# Patient Record
Sex: Male | Born: 1980 | Race: Black or African American | Hispanic: No | Marital: Single | Smoking: Current every day smoker
Health system: Southern US, Community
[De-identification: ages and names within clinical notes are randomized; demographics above are authoritative.]

---

## 2010-02-27 ENCOUNTER — Emergency Department (HOSPITAL_COMMUNITY): Admission: EM | Admit: 2010-02-27 | Discharge: 2010-02-27 | Payer: Self-pay | Admitting: Emergency Medicine

## 2011-07-24 ENCOUNTER — Emergency Department: Payer: Self-pay | Admitting: Emergency Medicine

## 2011-08-22 ENCOUNTER — Emergency Department: Payer: Self-pay | Admitting: Unknown Physician Specialty

## 2013-05-01 ENCOUNTER — Emergency Department (HOSPITAL_COMMUNITY)
Admission: EM | Admit: 2013-05-01 | Discharge: 2013-05-01 | Disposition: A | Discharge status: Home / Self Care | Payer: PRIVATE HEALTH INSURANCE | Attending: Emergency Medicine | Admitting: Emergency Medicine

## 2013-05-01 ENCOUNTER — Emergency Department (HOSPITAL_COMMUNITY): Payer: PRIVATE HEALTH INSURANCE

## 2013-05-01 ENCOUNTER — Encounter (HOSPITAL_COMMUNITY): Payer: Self-pay | Admitting: Family Medicine

## 2013-05-01 DIAGNOSIS — S6980XA Other specified injuries of unspecified wrist, hand and finger(s), initial encounter: Secondary | ICD-10-CM | POA: Insufficient documentation

## 2013-05-01 DIAGNOSIS — F17200 Nicotine dependence, unspecified, uncomplicated: Secondary | ICD-10-CM | POA: Insufficient documentation

## 2013-05-01 DIAGNOSIS — Y990 Civilian activity done for income or pay: Secondary | ICD-10-CM | POA: Insufficient documentation

## 2013-05-01 DIAGNOSIS — Y9289 Other specified places as the place of occurrence of the external cause: Secondary | ICD-10-CM | POA: Insufficient documentation

## 2013-05-01 DIAGNOSIS — W319XXA Contact with unspecified machinery, initial encounter: Secondary | ICD-10-CM | POA: Insufficient documentation

## 2013-05-01 DIAGNOSIS — Y9389 Activity, other specified: Secondary | ICD-10-CM | POA: Insufficient documentation

## 2013-05-01 DIAGNOSIS — IMO0001 Reserved for inherently not codable concepts without codable children: Secondary | ICD-10-CM

## 2013-05-01 DIAGNOSIS — M20019 Mallet finger of unspecified finger(s): Secondary | ICD-10-CM | POA: Insufficient documentation

## 2013-05-01 DIAGNOSIS — S6990XA Unspecified injury of unspecified wrist, hand and finger(s), initial encounter: Secondary | ICD-10-CM | POA: Insufficient documentation

## 2013-05-01 NOTE — ED Notes (Signed)
Splint applied

## 2013-05-01 NOTE — ED Provider Notes (Signed)
CSN: [id number]     Arrival date & time 05/01/13  1239 History  This chart was scribed for non-physician practitioner Reginald Ceo, PA-C, working with Reginald Octave, MD by Reginald Sims, ED Scribe. This patient was seen in room TR06C/TR06C and the patient's care was started at 1:24 PM.   Chief Complaint  Patient presents with  . Finger Injury   The history is provided by the patient. No language interpreter was used.   HPI Comments: Reginald Sims is a 32 y.o. male who presents to the Emergency Department complaining of an injury to his right ring finger that occurred last Wednesday, or 3-4 days ago, while he was at work and states that he "pushed something wrong" and "jammed my finger." He reports associated constant pain to the area.  Patient denies any other potential injuries. He states that he is unable to move the finger due to pain. He denies any numbness or paresthesias to the area, nausea, vomiting, or any other symptoms at this time. Patient is left-handed.   History reviewed. No pertinent past medical history. History reviewed. No pertinent past surgical history. History reviewed. No pertinent family history. History  Substance Use Topics  . Smoking status: Current Every Day Smoker  . Smokeless tobacco: Not on file  . Alcohol Use: Yes    Review of Systems  Gastrointestinal: Negative for nausea and vomiting.  Neurological: Negative for numbness.  All other systems reviewed and are negative.    Allergies  Review of patient's allergies indicates no known allergies.  Home Medications  No current outpatient prescriptions on file.  Triage Vitals: BP 126/67  Pulse 67  Temp(Src) 98.3 F (36.8 C)  Resp 18  SpO2 98%  Filed Vitals:   05/01/13 1253  BP: 126/67  Pulse: 67  Temp: 98.3 F (36.8 C)  Resp: 18  SpO2: 98%    Physical Exam  Nursing note and vitals reviewed. Constitutional: He is oriented to person, place, and time. He appears well-developed and  well-nourished. No distress.  HENT:  Head: Normocephalic and atraumatic.  Eyes: Conjunctivae are normal.  Neck: Normal range of motion. Neck supple.  Cardiovascular: Normal rate, regular rhythm and normal heart sounds.   Radial pulses present bilaterally.    Pulmonary/Chest: Effort normal and breath sounds normal. No respiratory distress.  Musculoskeletal: Normal range of motion. He exhibits no edema and no tenderness.  Unable to extend the right ring ringer at the DIP joint.  DIP joint in constant flexion.  No tenderness to palpation to the right ring finger throughout. No other limitations with ROM of the digits of the right hand. No tenderness to the other digits of the right hand or right wrist throughout.   Neurological: He is alert and oriented to person, place, and time.  Gross sensation to the right hand throughout.   Skin: Skin is warm and dry.  No erythema, edema, ecchymosis, or wounds to the hands bilaterally  Psychiatric: He has a normal mood and affect. His behavior is normal.    ED Course  Procedures (including critical care time)  DIAGNOSTIC STUDIES: Oxygen Saturation is 98% on room air, normal by my interpretation.    COORDINATION OF CARE: 1:26PM- Discussed that the pain may be due to an issue with the tendons in his fingers. Will order an x-ray of the right ring finger. Will order a splint to immobilize the finger. Discussed treatment plan with patient at bedside and patient verbalized agreement.   Labs Review Labs Reviewed - No data  to display  Imaging Review Dg Finger Ring Right  05/01/2013   CLINICAL DATA:  Injured finger. Can not extend the finger at the DIP joint.  EXAM: RIGHT RING FINGER 2+V  COMPARISON:  None  FINDINGS: There is a flexion deformity at the DIP joint of the ring finger. No definite fracture is identified. This is most likely due to a complete rupture of the extensor tendon.  IMPRESSION: Flexion deformity at the DIP joint of the ring finger most  consistent with a rupture of the extensor tendon.  No acute fracture.   Electronically Signed   By: Reginald Sims M.D.   On: 05/01/2013 14:03    MDM   1. 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 male who presents to the Emergency Department complaining of an injury to his right ring finger.  Finger x-ray ordered.  Finger splint ordered.     Patient likely has a mallet deformity of the 4th phalanx of the right hand.  No fracture or dislocation on x-rays. Patient unable to extend DIP joint.  No other neurovascular deficits. Finger was splinted.  Patient instructed to keep splint on for 6 weeks and not take it off.  Patient instructed to follow-up with orthopedics.  Patient was instructed to return to the ED if they experience any cyanosis, redness/edema, fever or other concerns.  Patient was in agreement with discharge and plan.     Final impressions: 1. 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 Manus Sims   I personally performed the services described in this documentation, which was scribed in my presence. The recorded information has been reviewed and is accurate.   Reginald Ledger, PA-C 05/03/13 1324

## 2013-05-01 NOTE — ED Notes (Signed)
Per pt sts was at work and injured right ring finger on a machine. Denies pain. There is finger deformity.

## 2013-05-03 NOTE — ED Provider Notes (Signed)
Medical screening examination/treatment/procedure(s) were performed by non-physician practitioner and as supervising physician I was immediately available for consultation/collaboration.   Adisson Deak, MD 05/03/13 1640 

## 2014-11-29 ENCOUNTER — Encounter (HOSPITAL_COMMUNITY): Payer: Self-pay

## 2014-11-29 ENCOUNTER — Emergency Department (HOSPITAL_COMMUNITY): Payer: PRIVATE HEALTH INSURANCE

## 2014-11-29 ENCOUNTER — Emergency Department (HOSPITAL_COMMUNITY)
Admission: EM | Admit: 2014-11-29 | Discharge: 2014-11-29 | Disposition: A | Discharge status: Home / Self Care | Payer: PRIVATE HEALTH INSURANCE | Attending: Emergency Medicine | Admitting: Emergency Medicine

## 2014-11-29 DIAGNOSIS — Z720 Tobacco use: Secondary | ICD-10-CM | POA: Insufficient documentation

## 2014-11-29 DIAGNOSIS — F439 Reaction to severe stress, unspecified: Secondary | ICD-10-CM | POA: Insufficient documentation

## 2014-11-29 DIAGNOSIS — R079 Chest pain, unspecified: Secondary | ICD-10-CM | POA: Insufficient documentation

## 2014-11-29 LAB — CBC
HEMATOCRIT: 42.2 % (ref 39.0–52.0)
Hemoglobin: 14.1 g/dL (ref 13.0–17.0)
MCH: 28.7 pg (ref 26.0–34.0)
MCHC: 33.4 g/dL (ref 30.0–36.0)
MCV: 85.8 fL (ref 78.0–100.0)
Platelets: 146 10*3/uL — ABNORMAL LOW (ref 150–400)
RBC: 4.92 MIL/uL (ref 4.22–5.81)
RDW: 13.3 % (ref 11.5–15.5)
WBC: 6.1 10*3/uL (ref 4.0–10.5)

## 2014-11-29 LAB — BASIC METABOLIC PANEL
Anion gap: 7 (ref 5–15)
BUN: 13 mg/dL (ref 6–23)
CHLORIDE: 103 mmol/L (ref 96–112)
CO2: 26 mmol/L (ref 19–32)
CREATININE: 1.26 mg/dL (ref 0.50–1.35)
Calcium: 8.9 mg/dL (ref 8.4–10.5)
GFR calc non Af Amer: 73 mL/min — ABNORMAL LOW (ref >90)
GFR, EST AFRICAN AMERICAN: 85 mL/min — AB (ref >90)
GLUCOSE: 104 mg/dL — AB (ref 70–99)
Potassium: 3.7 mmol/L (ref 3.5–5.1)
Sodium: 136 mmol/L (ref 135–145)

## 2014-11-29 LAB — I-STAT TROPONIN, ED: Troponin i, poc: 0 ng/mL (ref 0.00–0.08)

## 2014-11-29 MED ORDER — LORAZEPAM 1 MG PO TABS
1.0000 mg | ORAL_TABLET | Freq: Once | ORAL | Status: AC
  Administered 2014-11-29: 1 mg via ORAL
  Filled 2014-11-29: qty 1

## 2014-11-29 NOTE — ED Provider Notes (Signed)
CSN: [id number]     Arrival date & time 11/29/14  2055 History   First MD Initiated Contact with Patient 11/29/14 2118     Chief Complaint  Patient presents with  . Chest Pain     (Consider location/radiation/quality/duration/timing/severity/associated sxs/prior Treatment) HPI Comments: 34 year old male presents with insidious onset of chest pain x 1 hour. Describes pain as a pressure located in the center of the chest radiating to the right side of the chest that is 6/10. Also states right side of chest feels "very tight". No aggravating or alleviating factors. Pain began while patient was at work as a [employer]. No trauma to area, no exertional activity out of his normal, and no history of cardiac issues. Smokes approximately 0.5ppd. Is under significant stress both at work and at home. Denies fever, chills, difficulty breathing, shortness of breath, numbness, tingling, and weakness. No family history of early heart disease.  Patient is a 34 y.o. male presenting with chest pain. The history is provided by the patient.  Chest Pain   History reviewed. No pertinent past medical history. History reviewed. No pertinent past surgical history. History reviewed. No pertinent family history. History  Substance Use Topics  . Smoking status: Current Every Day Smoker  . Smokeless tobacco: Not on file  . Alcohol Use: Yes    Review of Systems  Cardiovascular: Positive for chest pain.  All other systems reviewed and are negative.     Allergies  Review of patient's allergies indicates no known allergies.  Home Medications   Prior to Admission medications   Medication Sig Start Date End Date Taking? Authorizing Provider  ibuprofen (ADVIL,MOTRIN) 200 MG tablet Take 400 mg by mouth every 6 (six) hours as needed for moderate pain.   Yes Historical Provider, MD   BP 138/88 mmHg  Pulse 80  Temp(Src) 98.3 F (36.8 C) (Oral)  Resp 18  Ht 5\' 6"  (1.676 m)  Wt 132 lb (59.875 kg)  BMI 21.32  kg/m2  SpO2 100% Physical Exam  Constitutional: He is oriented to person, place, and time. He appears well-developed and well-nourished. No distress.  HENT:  Head: Normocephalic and atraumatic.  Mouth/Throat: Oropharynx is clear and moist.  Eyes: Conjunctivae and EOM are normal. Pupils are equal, round, and reactive to light.  Neck: Normal range of motion. Neck supple. No JVD present.  Cardiovascular: Normal rate, regular rhythm, normal heart sounds and intact distal pulses.   No extremity edema.  Pulmonary/Chest: Effort normal and breath sounds normal. No respiratory distress. He exhibits no tenderness.  Abdominal: Soft. Bowel sounds are normal. There is no tenderness.  Musculoskeletal: Normal range of motion. He exhibits no edema.  Neurological: He is alert and oriented to person, place, and time. He has normal strength. No sensory deficit.  Speech fluent, goal oriented. Moves limbs without ataxia. Equal grip strength bilateral.  Skin: Skin is warm and dry. He is not diaphoretic.  Psychiatric: He has a normal mood and affect. His behavior is normal.  Nursing note and vitals reviewed.   ED Course  Procedures (including critical care time) Labs Review Labs Reviewed  CBC - Abnormal; Notable for the following:    Platelets 146 (*)    All other components within normal limits  BASIC METABOLIC PANEL - Abnormal; Notable for the following:    Glucose, Bld 104 (*)    GFR calc non Af Amer 73 (*)    GFR calc Af Amer 85 (*)    All other components within normal limits  Rosezena Sensor, ED    Imaging Review Dg Chest 2 View  11/29/2014   CLINICAL DATA:  Chest pain starting today  EXAM: CHEST  2 VIEW  COMPARISON:  None.  FINDINGS: The heart size and mediastinal contours are within normal limits. Both lungs are clear. The visualized skeletal structures are unremarkable.  IMPRESSION: Negative chest.   Electronically Signed   By: Marnee Spring M.D.   On: 11/29/2014 22:50     EKG  Interpretation   Date/Time:  Wednesday November 29 2014 21:06:17 EDT Ventricular Rate:  82 PR Interval:  152 QRS Duration: 91 QT Interval:  348 QTC Calculation: 406 R Axis:   86 Text Interpretation:  Sinus rhythm Borderline T wave abnormalities ST  elev, probable normal early repol pattern Sinus rhythm T wave abnormality  Left ventricular hypertrophy Early repolarization pattern Abnormal ekg  Confirmed by Gerhard Munch  MD [phone]) on 11/29/2014 9:08:51 PM      MDM   Final diagnoses:  Chest pain, unspecified chest pain type  Stress   Nontoxic appearing, NAD. AF VSS. Workup negative. Doubt cardiac. HEART score 2. PERC negative. Under increased stress. Possibly had a panic attack. I advised him to rest, take a few days off of work, and establish care with a primary care physician. Resources given for follow-up. Stable for discharge. Return precautions given. Patient states understanding of treatment care plan and is agreeable.  Kathrynn Speed, PA-C 11/29/14 2307  Purvis Sheffield, MD 11/29/14 [phone]

## 2014-11-29 NOTE — Discharge Instructions (Signed)
Follow-up with the wellness clinic or one of the resources below to establish care with a primary care physician. I highly encourage you to stop smoking.  Chest Pain (Nonspecific) It is often hard to give a specific diagnosis for the cause of chest pain. There is always a chance that your pain could be related to something serious, such as a heart attack or a blood clot in the lungs. You need to follow up with your health care provider for further evaluation. CAUSES   Heartburn.  Pneumonia or bronchitis.  Anxiety or stress.  Inflammation around your heart (pericarditis) or lung (pleuritis or pleurisy).  A blood clot in the lung.  A collapsed lung (pneumothorax). It can develop suddenly on its own (spontaneous pneumothorax) or from trauma to the chest.  Shingles infection (herpes zoster virus). The chest wall is composed of bones, muscles, and cartilage. Any of these can be the source of the pain.  The bones can be bruised by injury.  The muscles or cartilage can be strained by coughing or overwork.  The cartilage can be affected by inflammation and become sore (costochondritis). DIAGNOSIS  Lab tests or other studies may be needed to find the cause of your pain. Your health care provider may have you take a test called an ambulatory electrocardiogram (ECG). An ECG records your heartbeat patterns over a 24-hour period. You may also have other tests, such as:  Transthoracic echocardiogram (TTE). During echocardiography, sound waves are used to evaluate how blood flows through your heart.  Transesophageal echocardiogram (TEE).  Cardiac monitoring. This allows your health care provider to monitor your heart rate and rhythm in real time.  Holter monitor. This is a portable device that records your heartbeat and can help diagnose heart arrhythmias. It allows your health care provider to track your heart activity for several days, if needed.  Stress tests by exercise or by giving  medicine that makes the heart beat faster. TREATMENT   Treatment depends on what may be causing your chest pain. Treatment may include:  Acid blockers for heartburn.  Anti-inflammatory medicine.  Pain medicine for inflammatory conditions.  Antibiotics if an infection is present.  You may be advised to change lifestyle habits. This includes stopping smoking and avoiding alcohol, caffeine, and chocolate.  You may be advised to keep your head raised (elevated) when sleeping. This reduces the chance of acid going backward from your stomach into your esophagus. Most of the time, nonspecific chest pain will improve within 2-3 days with rest and mild pain medicine.  HOME CARE INSTRUCTIONS   If antibiotics were prescribed, take them as directed. Finish them even if you start to feel better.  For the next few days, avoid physical activities that bring on chest pain. Continue physical activities as directed.  Do not use any tobacco products, including cigarettes, chewing tobacco, or electronic cigarettes.  Avoid drinking alcohol.  Only take medicine as directed by your health care provider.  Follow your health care provider's suggestions for further testing if your chest pain does not go away.  Keep any follow-up appointments you made. If you do not go to an appointment, you could develop lasting (chronic) problems with pain. If there is any problem keeping an appointment, call to reschedule. SEEK MEDICAL CARE IF:   Your chest pain does not go away, even after treatment.  You have a rash with blisters on your chest.  You have a fever. SEEK IMMEDIATE MEDICAL CARE IF:   You have increased chest pain  or pain that spreads to your arm, neck, jaw, back, or abdomen.  You have shortness of breath.  You have an increasing cough, or you cough up blood.  You have severe back or abdominal pain.  You feel nauseous or vomit.  You have severe weakness.  You faint.  You have  chills. This is an emergency. Do not wait to see if the pain will go away. Get medical help at once. Call your local emergency services (911 in [place] Do not drive yourself to the hospital. MAKE SURE YOU:   Understand these instructions.  Will watch your condition.  Will get help right away if you are not doing well or get worse. Document Released: 05/14/2005 Document Revised: 08/09/2013 Document Reviewed: 03/09/2008 [employer] [facility] Patient Information 2015 [place], [place]. This information is not intended to replace advice given to you by your health care provider. Make sure you discuss any questions you have with your health care provider.  Stress Stress-related medical problems are becoming increasingly common. The body has a built-in physical response to stressful situations. Faced with pressure, challenge or danger, we need to react quickly. Our bodies release hormones such as cortisol and adrenaline to help do this. These hormones are part of the "fight or flight" response and affect the metabolic rate, heart rate and blood pressure, resulting in a heightened, stressed state that prepares the body for optimum performance in dealing with a stressful situation. It is likely that early man required these mechanisms to stay alive, but usually modern stresses do not call for this, and the same hormones released in today's world can damage health and reduce coping ability. CAUSES  Pressure to perform at work, at school or in sports.  Threats of physical violence.  Money worries.  Arguments.  Family conflicts.  Divorce or separation from significant other.  Bereavement.  New job or unemployment.  Changes in location.  Alcohol or drug abuse. SOMETIMES, THERE IS NO PARTICULAR REASON FOR DEVELOPING STRESS. Almost all people are at risk of being stressed at some time in their lives. It is important to know that some stress is temporary and some is long term.  Temporary stress will go away  when a situation is resolved. Most people can cope with short periods of stress, and it can often be relieved by relaxing, taking a walk or getting any type of exercise, chatting through issues with friends, or having a good night's sleep.  Chronic (long-term, continuous) stress is much harder to deal with. It can be psychologically and emotionally damaging. It can be harmful both for an individual and for friends and family. SYMPTOMS Everyone reacts to stress differently. There are some common effects that help [place] recognize it. In times of extreme stress, people may:  Shake uncontrollably.  Breathe faster and deeper than normal (hyperventilate).  Vomit.  For people with asthma, stress can trigger an attack.  For some people, stress may trigger migraine headaches, ulcers, and body pain. PHYSICAL EFFECTS OF STRESS MAY INCLUDE:  Loss of energy.  Skin problems.  Aches and pains resulting from tense muscles, including neck ache, backache and tension headaches.  Increased pain from arthritis and other conditions.  Irregular heart beat (palpitations).  Periods of irritability or anger.  Apathy or depression.  Anxiety (feeling uptight or worrying).  Unusual behavior.  Loss of appetite.  Comfort eating.  Lack of concentration.  Loss of, or decreased, sex-drive.  Increased smoking, drinking, or recreational drug use.  For women, missed periods.  Ulcers, joint  pain, and muscle pain. Post-traumatic stress is the stress caused by any serious accident, strong emotional damage, or extremely difficult or violent experience such as rape or war. Post-traumatic stress victims can experience mixtures of emotions such as fear, shame, depression, guilt or anger. It may include recurrent memories or images that may be haunting. These feelings can last for weeks, months or even years after the traumatic event that triggered them. Specialized treatment, possibly with medicines and  psychological therapies, is available. If stress is causing physical symptoms, severe distress or making it difficult for you to function as normal, it is worth seeing your caregiver. It is important to remember that although stress is a usual part of life, extreme or prolonged stress can lead to other illnesses that will need treatment. It is better to visit a doctor sooner rather than later. Stress has been linked to the development of high blood pressure and heart disease, as well as insomnia and depression. There is no diagnostic test for stress since everyone reacts to it differently. But a caregiver will be able to spot the physical symptoms, such as:  Headaches.  Shingles.  Ulcers. Emotional distress such as intense worry, low mood or irritability should be detected when the doctor asks pertinent questions to identify any underlying problems that might be the cause. In case there are physical reasons for the symptoms, the doctor may also want to do some tests to exclude certain conditions. If you feel that you are suffering from stress, try to identify the aspects of your life that are causing it. Sometimes you may not be able to change or avoid them, but even a small change can have a positive ripple effect. A simple lifestyle change can make all the difference. STRATEGIES THAT CAN HELP DEAL WITH STRESS:  Delegating or sharing responsibilities.  Avoiding confrontations.  Learning to be more assertive.  Regular exercise.  Avoid using alcohol or street drugs to cope.  Eating a healthy, balanced diet, rich in fruit and vegetables and proteins.  Finding humor or absurdity in stressful situations.  Never taking on more than you know you can handle comfortably.  Organizing your time better to get as much done as possible.  Talking to friends or family and sharing your thoughts and fears.  Listening to music or relaxation tapes.  Relaxation techniques like deep breathing,  meditation, and yoga.  Tensing and then relaxing your muscles, starting at the toes and working up to the head and neck. If you think that you would benefit from help, either in identifying the things that are causing your stress or in learning techniques to help you relax, see a caregiver who is capable of helping you with this. Rather than relying on medications, it is usually better to try and identify the things in your life that are causing stress and try to deal with them. There are many techniques of managing stress including counseling, psychotherapy, aromatherapy, yoga, and exercise. Your caregiver can help you determine what is best for you. Document Released: 10/25/2002 Document Revised: 08/09/2013 Document Reviewed: 09/21/2007 [facility] Patient Information 2015 [place], [place]. This information is not intended to replace advice given to you by your health care provider. Make sure you discuss any questions you have with your health care provider. RESOURCE GUIDE  Chronic Pain Problems: Contact [facility]  [phone] Patients need to be referred by their primary care doctor.  Insufficient Money for Medicine: Contact [employer]:  call "211."   No Primary  Care Doctor: - Call [facility]  [phone] - can help you locate a primary care doctor that  accepts your insurance, provides certain services, etc. - Physician Referral Service- [phone]  Agencies that provide inexpensive medical care: - [facility]  [phone] - [facility]  [phone] - [facility]  [phone] - [facility]  [phone] - [facility]  [phone] [facility]  [phone]  Medicaid-accepting [facility] Providers: - [facility]- [address]  [phone], Mon-Fri 9am-7pm, Sat 9am-1pm - [facility]- [address] [place], [address] [place]  [phone] - [facility]- [address] [place]  [phone] [facility]- [address]  [phone] - Renaye Rakers- [address] [place], [address], [phone]  Only accepts [place] patients after they have their name  applied to their card  Self Pay (no insurance) in [place]: - Sickle Cell Patients: Dr Willey Blade, [facility]  [address] [place], [phone] - [facility]- [address] [place]  [phone]       [facility]- [address]       -     [facility]- see information above (Speak to [employer] if you do not have insurance)       -  [facility]- [address],  [phone]       -  [facility]- [address], [phone]       -  Dr Julio Sicks-  [address], [place], [phone]       -  [facility] - [address] [phone]       -  [facility]- [address], [phone], also [address], [phone]       -    [facility]- [address] [place], [phone], 1st & 3rd Saturday        every month, 10am-1pm  1) Find a Doctor and Pay Out of Pocket Although you won't have to find out who is covered by your insurance plan, it is a good idea to ask around and get recommendations. You will then need to call the office and see if the doctor you have chosen will accept you as a new patient and what types of options they offer for patients who are self-pay. Some doctors offer discounts or will set up payment plans for their patients who do not have insurance, but you will need to ask so you aren't surprised when you get to your appointment.  2) Contact Your Local Health Department Not all health departments have doctors that can see patients for sick visits, but many do, so it is worth a call to see if yours does. If you don't know where your local health department is, you can check in your phone book.  The CDC also has a tool to help you locate your state's health department, and many state websites also have listings of all of their local health departments.  3) Find a Walk-in Clinic If your illness is not likely to be very severe or complicated, you may want to try a walk in clinic. These are popping up all over the country in pharmacies, drugstores, and shopping centers. They're usually staffed  by nurse practitioners or physician assistants that have been trained to treat common illnesses and complaints. They're usually fairly quick and inexpensive. However, if you have serious medical issues or chronic medical problems, these are probably not your best option

## 2014-11-29 NOTE — ED Notes (Signed)
Pt complains of right sided chest pain that started about one hour ago, he describes it as someone standing on him

## 2014-11-29 NOTE — ED Notes (Signed)
Patient states right sided chest pressure that started tonight while on break at work.

## 2014-11-29 NOTE — ED Notes (Signed)
Patient verbalizes understanding of discharge instructions, home care, and follow up care. Patient ambulatory out of department at this time with family member

## 2014-12-06 ENCOUNTER — Inpatient Hospital Stay: Payer: PRIVATE HEALTH INSURANCE | Admitting: Family Medicine

## 2016-01-10 IMAGING — CR DG CHEST 2V
2 series · 2 of 2 positions shown · non-contrast
Comparison: None.

CLINICAL DATA: Chest pain starting today

EXAM:
CHEST  2 VIEW

[w chest pa]
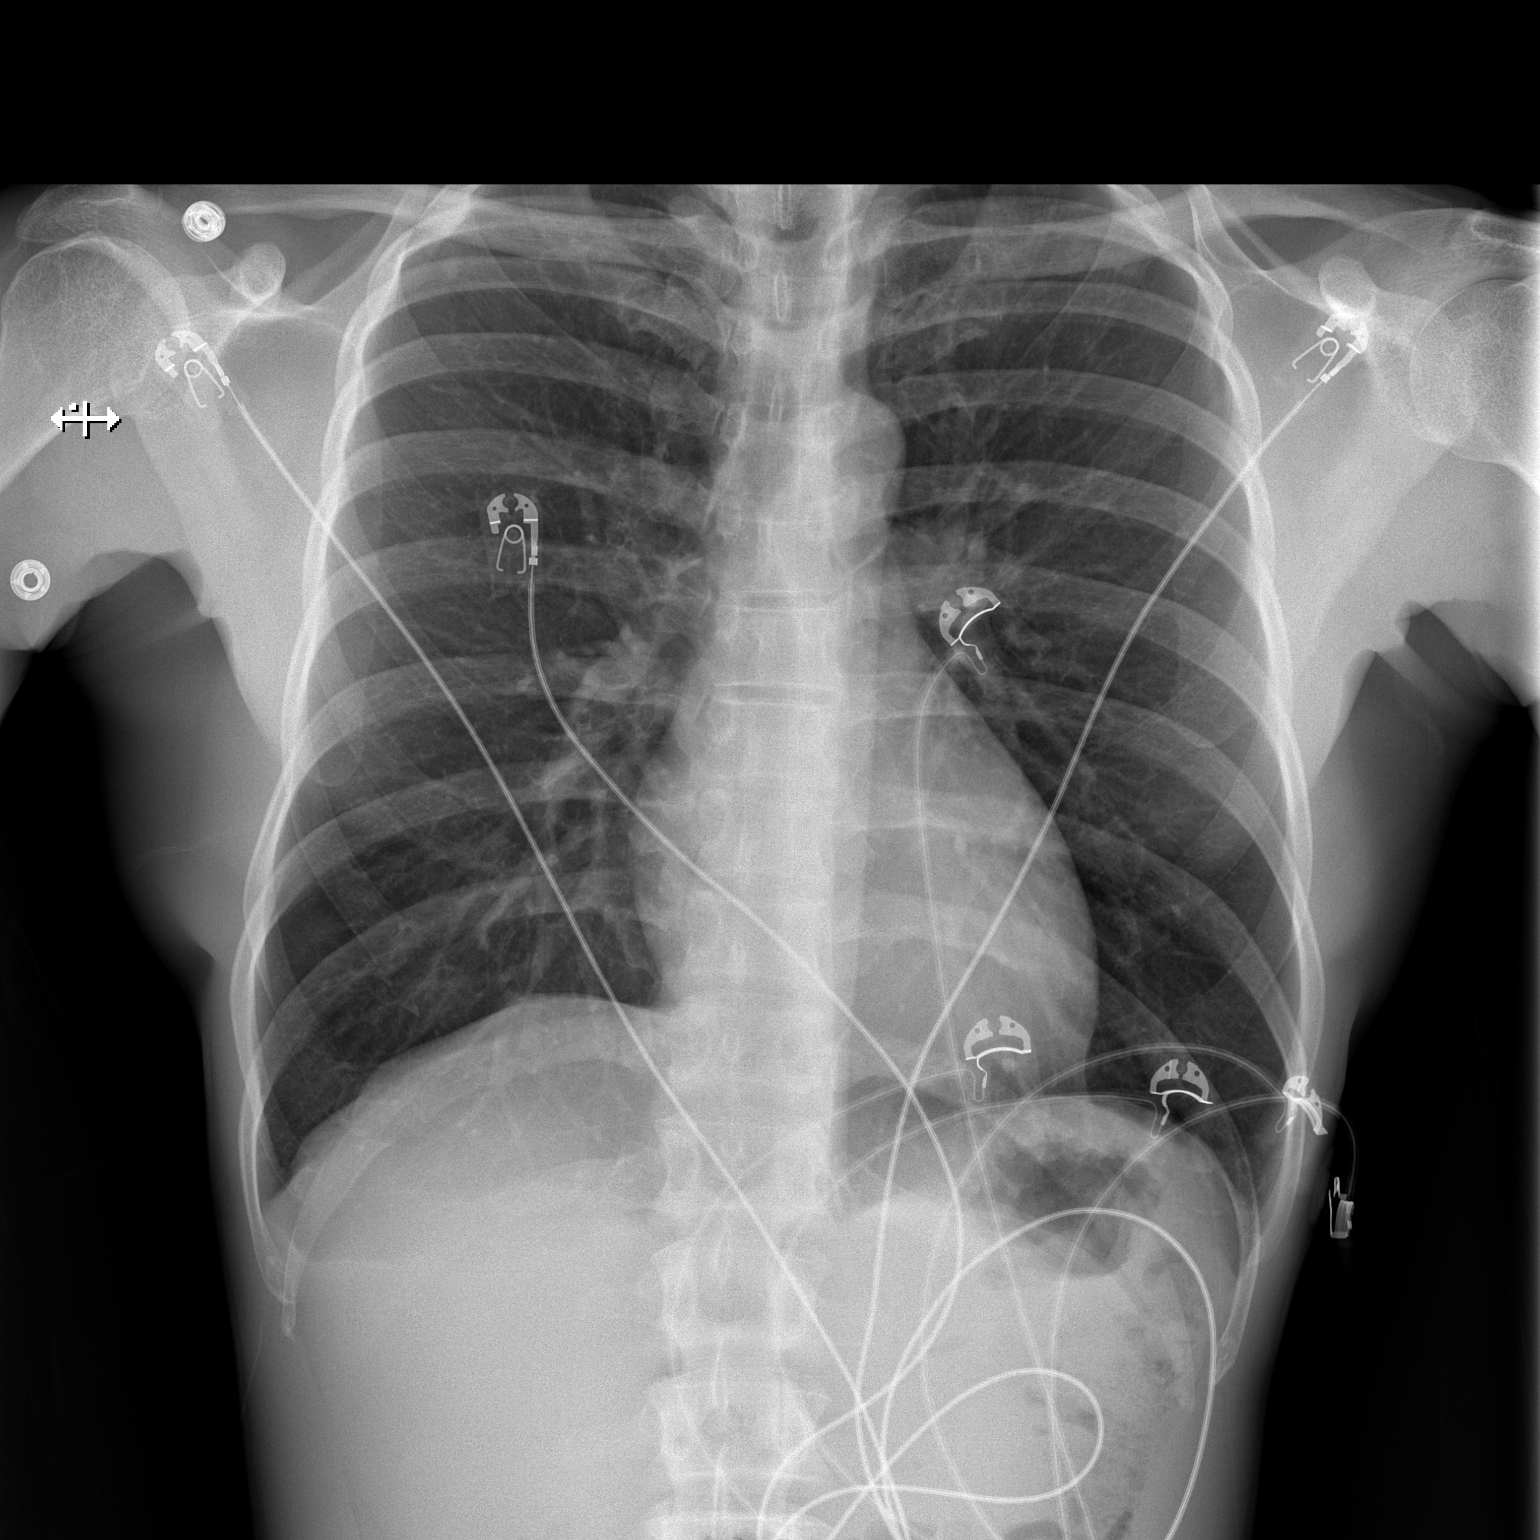

[w chest lat]
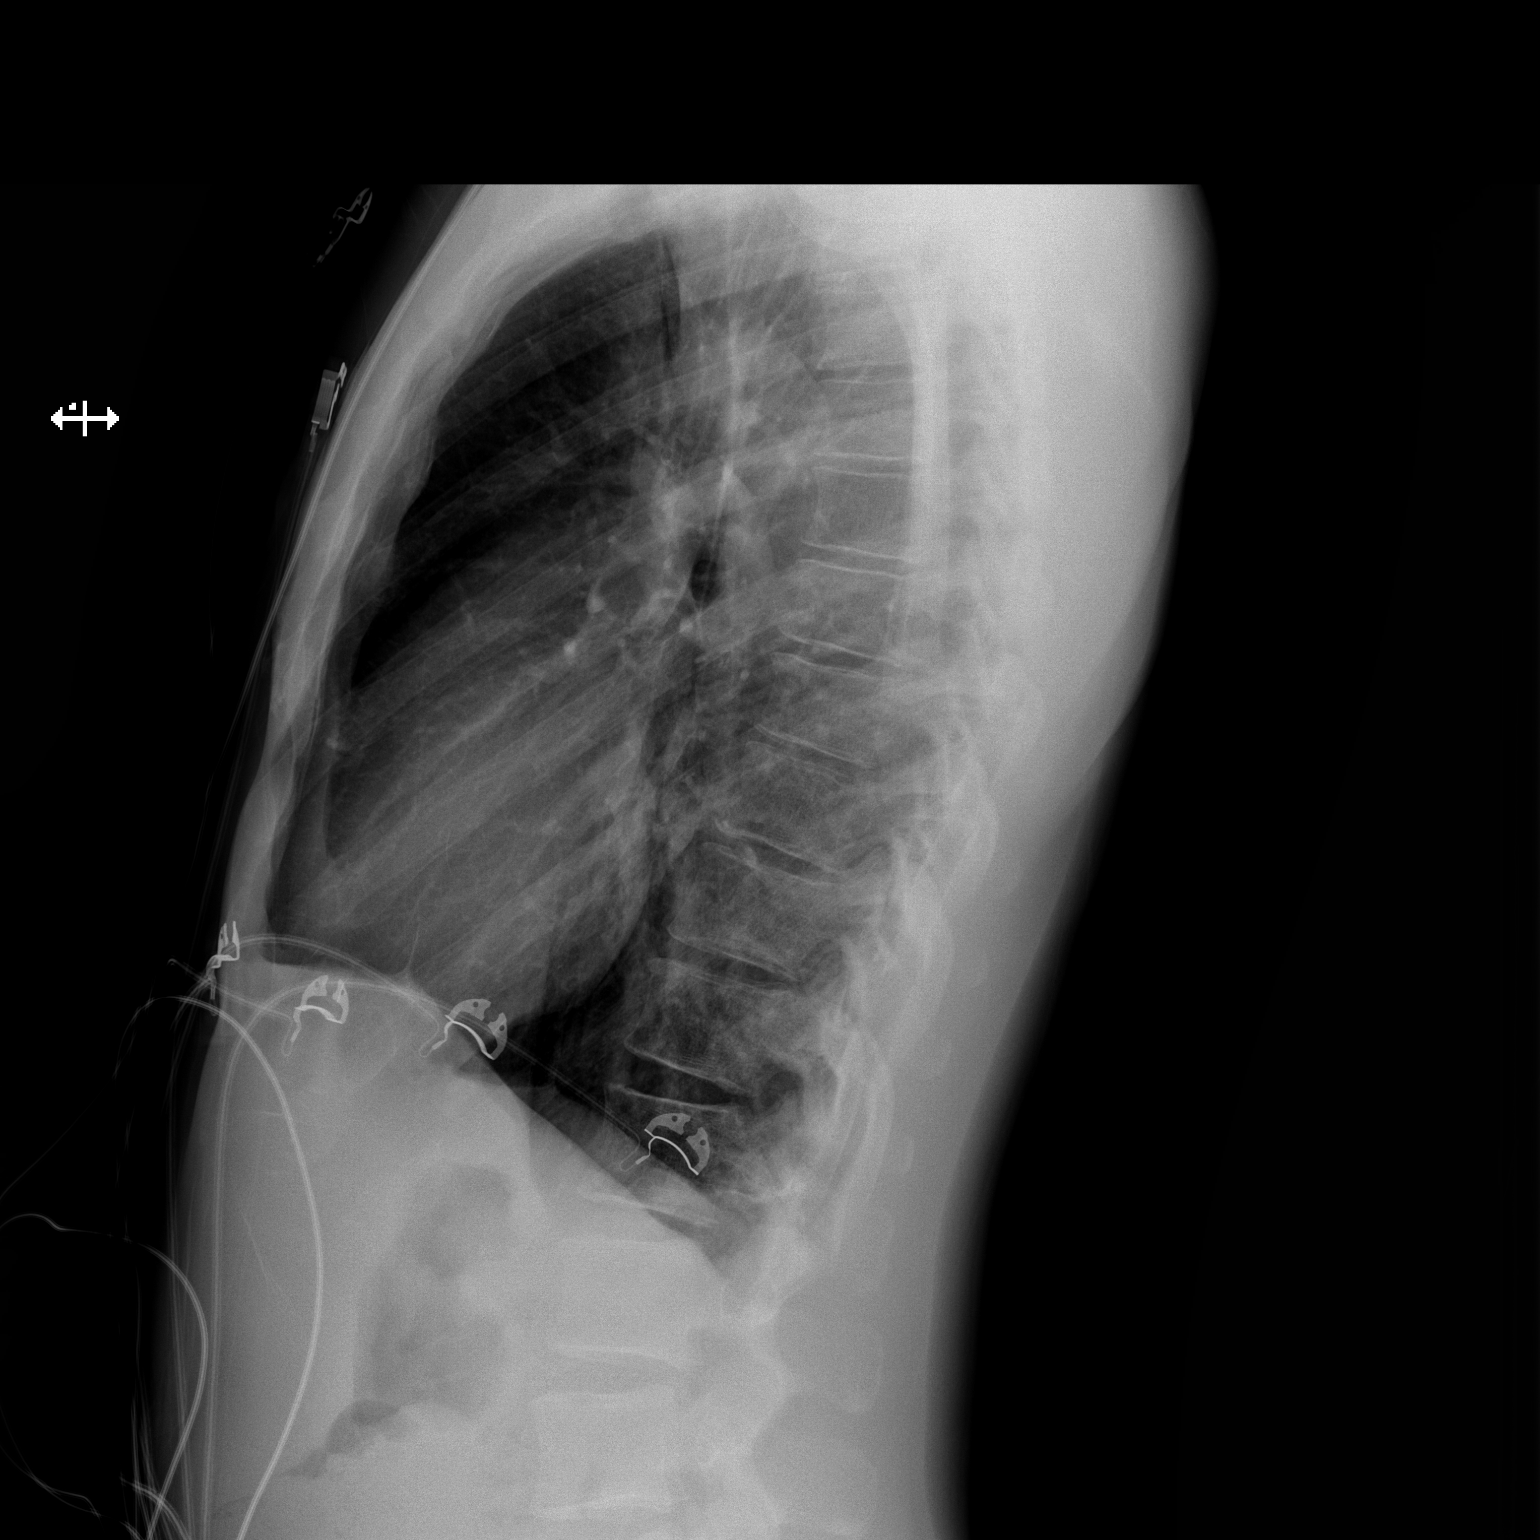

[2 of 2 positions shown; findings below may reference images not displayed]

FINDINGS: The heart size and mediastinal contours are within normal limits.
Both lungs are clear. The visualized skeletal structures are
unremarkable.
IMPRESSION: Negative chest.

## 2018-09-15 ENCOUNTER — Emergency Department
Admission: EM | Admit: 2018-09-15 | Discharge: 2018-09-15 | Disposition: A | Discharge status: Home / Self Care | Payer: PRIVATE HEALTH INSURANCE | Attending: Emergency Medicine | Admitting: Emergency Medicine

## 2018-09-15 ENCOUNTER — Other Ambulatory Visit: Payer: Self-pay

## 2018-09-15 DIAGNOSIS — W25XXXA Contact with sharp glass, initial encounter: Secondary | ICD-10-CM | POA: Insufficient documentation

## 2018-09-15 DIAGNOSIS — Y9389 Activity, other specified: Secondary | ICD-10-CM | POA: Insufficient documentation

## 2018-09-15 DIAGNOSIS — Z23 Encounter for immunization: Secondary | ICD-10-CM | POA: Insufficient documentation

## 2018-09-15 DIAGNOSIS — Y929 Unspecified place or not applicable: Secondary | ICD-10-CM | POA: Insufficient documentation

## 2018-09-15 DIAGNOSIS — S61012A Laceration without foreign body of left thumb without damage to nail, initial encounter: Secondary | ICD-10-CM

## 2018-09-15 DIAGNOSIS — Y998 Other external cause status: Secondary | ICD-10-CM | POA: Insufficient documentation

## 2018-09-15 DIAGNOSIS — F17200 Nicotine dependence, unspecified, uncomplicated: Secondary | ICD-10-CM | POA: Insufficient documentation

## 2018-09-15 MED ORDER — LIDOCAINE HCL (PF) 1 % IJ SOLN
5.0000 mL | Freq: Once | INTRAMUSCULAR | Status: AC
  Administered 2018-09-15: 5 mL
  Filled 2018-09-15: qty 5

## 2018-09-15 MED ORDER — TETANUS-DIPHTH-ACELL PERTUSSIS 5-2.5-18.5 LF-MCG/0.5 IM SUSP
0.5000 mL | Freq: Once | INTRAMUSCULAR | Status: AC
  Administered 2018-09-15: 0.5 mL via INTRAMUSCULAR
  Filled 2018-09-15: qty 0.5

## 2018-09-15 NOTE — ED Triage Notes (Signed)
Pt has cut to left base of left thumb with glass.

## 2018-09-15 NOTE — Discharge Instructions (Addendum)
Keep the areas dry as possible.  If you need to wash your hands just use soap and water and gently dry the area.  Wear a dressing on the area if you are out in public.  Once the bleeding has stopped you may allow the area to get air while you are at home.  Return to the emergency department in 7 to 10 days for suture removal or you may remove them yourself.  If there is any sign of infection please return to the emergency department.

## 2018-09-15 NOTE — ED Provider Notes (Signed)
[facility] Emergency Department Provider Note  ____________________________________________   First MD Initiated Contact with Patient 09/15/18 2042     (approximate)  I have reviewed the triage vital signs and the nursing notes.   HISTORY  Chief Complaint Laceration    HPI Reginald Sims is a 38 y.o. male presents emergency department laceration to the left thumb.  States he was trying to hang a TV on the wall got cut by glass.  Unsure of his last tetanus    No past medical history on file.  There are no active problems to display for this patient.   No past surgical history on file.  Prior to Admission medications   Medication Sig Start Date End Date Taking? Authorizing Provider  ibuprofen (ADVIL,MOTRIN) 200 MG tablet Take 400 mg by mouth every 6 (six) hours as needed for moderate pain.    [provider]    Allergies Patient has no known allergies.  No family history on file.  Social History Social History   Tobacco Use  . Smoking status: Current Every Day Smoker  Substance Use Topics  . Alcohol use: Yes  . Drug use: Not on file    Review of Systems  Constitutional: No fever/chills Eyes: No visual changes. ENT: No sore throat. Respiratory: Denies cough Genitourinary: Negative for dysuria. Musculoskeletal: Negative for back pain.  Positive for laceration to the left thumb Skin: Negative for rash.    ____________________________________________   PHYSICAL EXAM:  VITAL SIGNS: ED Triage Vitals  Enc Vitals Group     BP 09/15/18 2000 (!) 156/94     Pulse Rate 09/15/18 2000 (!) 56     Resp 09/15/18 2000 16     Temp 09/15/18 2000 98 F (36.7 C)     Temp Source 09/15/18 2000 Oral     SpO2 09/15/18 2000 98 %     Weight 09/15/18 2000 145 lb (65.8 kg)     Height 09/15/18 2000 5\' 7"  (1.702 m)     Head Circumference --      Peak Flow --      Pain Score 09/15/18 2003 2     Pain Loc --      Pain Edu? --    Excl. in GC? --     Constitutional: Alert and oriented. Well appearing and in no acute distress. Eyes: Conjunctivae are normal.  Head: Atraumatic. Nose: No congestion/rhinnorhea. Mouth/Throat: Mucous membranes are moist.   Neck:  supple no lymphadenopathy noted Cardiovascular: Normal rate, regular rhythm.  Respiratory: Normal respiratory effort.  No retractions, GU: deferred Musculoskeletal: FROM all extremities, warm and well perfused, positive for a flap-like laceration to the left thumb.  No foreign bodies noted.  No tendon involvement is noted.  Neurovascular is intact. Neurologic:  Normal speech and language.  Skin:  Skin is warm, dry and intact. No rash noted. Psychiatric: Mood and affect are normal. Speech and behavior are normal.  ____________________________________________   LABS (all labs ordered are listed, but only abnormal results are displayed)  Labs Reviewed - No data to display ____________________________________________   ____________________________________________  RADIOLOGY    ____________________________________________   PROCEDURES  Procedure(s) performed:   Marland KitchenMarland KitchenLaceration Repair Date/Time: 09/15/2018 9:33 PM Performed by: Faythe Ghee, PA-C Authorized by: Faythe Ghee, PA-C   Consent:    Consent obtained:  Verbal   Consent given by:  Patient   Risks discussed:  Infection, pain, retained foreign body, tendon damage, poor cosmetic result and poor wound healing  Alternatives discussed:  Delayed treatment Anesthesia (see MAR for exact dosages):    Anesthesia method:  Local infiltration   Local anesthetic:  Lidocaine 1% w/o epi Laceration details:    Location:  Finger   Finger location:  L thumb   Length (cm):  2   Depth (mm):  3 Repair type:    Repair type:  Simple Pre-procedure details:    Preparation:  Patient was prepped and draped in usual sterile fashion Exploration:    Hemostasis achieved with:  Direct pressure   Wound  exploration: wound explored through full range of motion     Wound extent: no foreign bodies/material noted, no muscle damage noted, no nerve damage noted, no tendon damage noted, no underlying fracture noted and no vascular damage noted   Treatment:    Area cleansed with:  Betadine and saline   Amount of cleaning:  Standard   Irrigation solution:  Sterile saline   Irrigation method:  Syringe and tap Skin repair:    Repair method:  Sutures   Suture size:  5-0   Suture material:  Nylon   Suture technique:  Simple interrupted   Number of sutures:  6 Approximation:    Approximation:  Close Post-procedure details:    Dressing:  Non-adherent dressing   Patient tolerance of procedure:  Tolerated well, no immediate complications      ____________________________________________   INITIAL IMPRESSION / ASSESSMENT AND PLAN / ED COURSE  Pertinent labs & imaging results that were available during my care of the patient were reviewed by me and considered in my medical decision making (see chart for details).   Patient is 38 year old male presents emergency department for laceration left thumb.  Positive laceration to the left thumb.  See procedure note. Tdap was given by the nursing staff.  Patient is to have the sutures removed in approximately 10 days.  Keep the areas clean and dry as possible.  If he has to wash the areas to use soap and water only.  Return to the emergency department any sign of infection.  He states he understands will comply.  He was discharged in stable condition.     As part of my medical decision making, I reviewed the following data within the electronic MEDICAL RECORD NUMBER Nursing notes reviewed and incorporated, Old chart reviewed, Notes from prior ED visits and [place] Controlled Substance Database  ____________________________________________   FINAL CLINICAL IMPRESSION(S) / ED DIAGNOSES  Final diagnoses:  Laceration of left thumb without foreign body without  damage to nail, initial encounter      NEW MEDICATIONS STARTED DURING THIS VISIT:  New Prescriptions   No medications on file     Note:  This document was prepared using Dragon voice recognition software and may include unintentional dictation errors.    Faythe Ghee, PA-C 09/15/18 2135    Schaevitz, Myra Rude, MD 09/15/18 [phone]

## 2019-11-10 ENCOUNTER — Emergency Department: Payer: Self-pay

## 2019-11-10 ENCOUNTER — Other Ambulatory Visit: Payer: Self-pay

## 2019-11-10 ENCOUNTER — Emergency Department
Admission: EM | Admit: 2019-11-10 | Discharge: 2019-11-10 | Disposition: A | Discharge status: Home / Self Care | Payer: Self-pay | Attending: Emergency Medicine | Admitting: Emergency Medicine

## 2019-11-10 DIAGNOSIS — R0789 Other chest pain: Secondary | ICD-10-CM | POA: Insufficient documentation

## 2019-11-10 DIAGNOSIS — R0602 Shortness of breath: Secondary | ICD-10-CM | POA: Insufficient documentation

## 2019-11-10 DIAGNOSIS — F17210 Nicotine dependence, cigarettes, uncomplicated: Secondary | ICD-10-CM | POA: Insufficient documentation

## 2019-11-10 DIAGNOSIS — Z20822 Contact with and (suspected) exposure to covid-19: Secondary | ICD-10-CM | POA: Insufficient documentation

## 2019-11-10 LAB — CBC
HCT: 44.3 % (ref 39.0–52.0)
Hemoglobin: 14.7 g/dL (ref 13.0–17.0)
MCH: 28.7 pg (ref 26.0–34.0)
MCHC: 33.2 g/dL (ref 30.0–36.0)
MCV: 86.4 fL (ref 80.0–100.0)
Platelets: 161 10*3/uL (ref 150–400)
RBC: 5.13 MIL/uL (ref 4.22–5.81)
RDW: 13.5 % (ref 11.5–15.5)
WBC: 6 10*3/uL (ref 4.0–10.5)
nRBC: 0 % (ref 0.0–0.2)

## 2019-11-10 LAB — COMPREHENSIVE METABOLIC PANEL
ALT: 25 U/L (ref 0–44)
AST: 26 U/L (ref 15–41)
Albumin: 4.2 g/dL (ref 3.5–5.0)
Alkaline Phosphatase: 43 U/L (ref 38–126)
Anion gap: 5 (ref 5–15)
BUN: 17 mg/dL (ref 6–20)
CO2: 30 mmol/L (ref 22–32)
Calcium: 9 mg/dL (ref 8.9–10.3)
Chloride: 104 mmol/L (ref 98–111)
Creatinine, Ser: 1.29 mg/dL — ABNORMAL HIGH (ref 0.61–1.24)
GFR calc Af Amer: >60 mL/min (ref >60)
GFR calc non Af Amer: >60 mL/min (ref >60)
Glucose, Bld: 105 mg/dL — ABNORMAL HIGH (ref 70–99)
Potassium: 4.7 mmol/L (ref 3.5–5.1)
Sodium: 139 mmol/L (ref 135–145)
Total Bilirubin: 0.5 mg/dL (ref 0.3–1.2)
Total Protein: 7 g/dL (ref 6.5–8.1)

## 2019-11-10 LAB — SARS CORONAVIRUS 2 (TAT 6-24 HRS): SARS Coronavirus 2: NEGATIVE

## 2019-11-10 LAB — TROPONIN I (HIGH SENSITIVITY)
Troponin I (High Sensitivity): 4 ng/L (ref <18)
Troponin I (High Sensitivity): 3 ng/L (ref <18)

## 2019-11-10 MED ORDER — HYDROXYZINE HCL 25 MG PO TABS: 25.0000 mg | ORAL_TABLET | Freq: Three times a day (TID) | ORAL | 0 refills | Status: AC | PRN

## 2019-11-10 MED ORDER — HYDROXYZINE HCL 25 MG PO TABS: 25.0000 mg | ORAL_TABLET | Freq: Three times a day (TID) | ORAL | 0 refills | Status: DC | PRN

## 2019-11-10 NOTE — ED Provider Notes (Signed)
[facility] Emergency Department Provider Note  ____________________________________________   First MD Initiated Contact with Patient 11/10/19 [phone]     (approximate)  I have reviewed the triage vital signs and the nursing notes.   HISTORY  Chief Complaint Shortness of Breath    HPI Reginald Sims is a 39 y.o. male otherwise healthy, no prior lung history who comes in with shortness of breath.  Patient states that he was in the back of a car with a total of 5 people in the car when he started feeling really short of breath had some chest tightness and felt like his throat was swollen.  This started around 2 AM, constant, slightly better but still feeling short of breath, unclear what brought it on.  Denies eating any strange foods or having any rash or prior allergic reactions.  Denies eating something and feeling like he gets stuck in his throat.  States that he currently still feels like he cannot catch his breath. He states he feels like if he falls asleep he wont wake up.  Patient states he no longer smokes.  Patient denies any known coronavirus contacts.  Patient does use THC but none today.  Denies alcohol use today.  Denies any risk factors for PE      Medical: None  There are no problems to display for this patient.   No past surgical history on file.  Prior to Admission medications   Medication Sig Start Date End Date Taking? Authorizing Provider  ibuprofen (ADVIL,MOTRIN) 200 MG tablet Take 400 mg by mouth every 6 (six) hours as needed for moderate pain.    [provider]    Allergies Patient has no known allergies.  No family history on file.  Social History Social History   Tobacco Use  . Smoking status: Current Every Day Smoker  Substance Use Topics  . Alcohol use: Yes  . Drug use: Not on file      Review of Systems Constitutional: No fever/chills Eyes: No visual changes. ENT: Feeling like something is in his  throat Cardiovascular: No chest pain Respiratory: Positive for SOB Gastrointestinal: No abdominal pain.  No nausea, no vomiting.  No diarrhea.  No constipation. Genitourinary: Negative for dysuria. Musculoskeletal: Negative for back pain. Skin: Negative for rash. Neurological: Negative for headaches, focal weakness or numbness. All other ROS negative ____________________________________________   PHYSICAL EXAM:  VITAL SIGNS: ED Triage Vitals  Enc Vitals Group     BP 11/10/19 0502 (!) 141/69     Pulse Rate 11/10/19 0502 (!) 59     Resp 11/10/19 0502 20     Temp 11/10/19 0502 97.9 F (36.6 C)     Temp Source 11/10/19 0502 Oral     SpO2 11/10/19 0502 100 %     Weight 11/10/19 0503 132 lb (59.9 kg)     Height 11/10/19 0503 5\' 6"  (1.676 m)     Head Circumference --      Peak Flow --      Pain Score 11/10/19 0503 0     Pain Loc --      Pain Edu? --      Excl. in GC? --     Constitutional: Alert and oriented. Well appearing and in no acute distress. Eyes: Conjunctivae are normal. EOMI. Head: Atraumatic. Nose: No congestion/rhinnorhea. Mouth/Throat: Mucous membranes are moist.  OP is clear Neck: No stridor. Trachea Midline. FROM Cardiovascular: Normal rate, regular rhythm. Grossly normal heart sounds.  Good peripheral  circulation. Respiratory: Clear lungs bilaterally, no increased work of breathing satting 100% on room air Gastrointestinal: Soft and nontender. No distention. No abdominal bruits.  Musculoskeletal: No lower extremity tenderness nor edema.  No joint effusions. Neurologic:  Normal speech and language. No gross focal neurologic deficits are appreciated.  Skin:  Skin is warm, dry and intact. No rash noted. Psychiatric: Mood and affect are normal. Speech and behavior are normal. GU: Deferred   ____________________________________________   LABS (all labs ordered are listed, but only abnormal results are displayed)  Labs Reviewed  COMPREHENSIVE METABOLIC PANEL  - Abnormal; Notable for the following components:      Result Value   Glucose, Bld 105 (*)    Creatinine, Ser 1.29 (*)    All other components within normal limits  SARS CORONAVIRUS 2 (TAT 6-24 HRS)  CBC  TROPONIN I (HIGH SENSITIVITY)  TROPONIN I (HIGH SENSITIVITY)   ____________________________________________   ED ECG REPORT I, Concha Se, the attending physician, personally viewed and interpreted this ECG.  EKG is sinus bradycardia rate of 41 with sinus arrhythmia, diffuse ST elevation most likely consistent with early repolarization, normal intervals ____________________________________________  RADIOLOGY Vela Prose, personally viewed and evaluated these images (plain radiographs) as part of my medical decision making, as well as reviewing the written report by the radiologist.  ED MD interpretation: No pneumonia  Official radiology report(s): DG Chest 2 View  Result Date: 11/10/2019 CLINICAL DATA:  Shortness of breath. EXAM: CHEST - 2 VIEW COMPARISON:  11/29/2014. FINDINGS: The heart size and mediastinal contours are within normal limits. Both lungs are clear. The visualized skeletal structures are unremarkable. IMPRESSION: No active cardiopulmonary disease.  Exam stable from prior exam. Electronically Signed   By: Maisie Fus  Register   On: 11/10/2019 05:38    ____________________________________________   PROCEDURES  Procedure(s) performed (including Critical Care):  Procedures   ____________________________________________   INITIAL IMPRESSION / ASSESSMENT AND PLAN / ED COURSE   Reginald Sims was evaluated in Emergency Department on 11/10/2019 for the symptoms described in the history of present illness. He was evaluated in the context of the global COVID-19 pandemic, which necessitated consideration that the patient might be at risk for infection with the SARS-CoV-2 virus that causes COVID-19. Institutional protocols and algorithms that pertain to the  evaluation of patients at risk for COVID-19 are in a state of rapid change based on information released by regulatory bodies including the CDC and federal and state organizations. These policies and algorithms were followed during the patient's care in the ED.     Pt presents with SOB. Differential includes: PNA-will get xray to evaluation Anemia-CBC to evaluate ACS- will get trops x2 Arrhythmia-Will get EKG and keep on monitor.  COVID- will get testing per algorithm. PE-lower suspicion given no risk factors and other cause more likely, PERC negative  Labs re-assuring.  Possible component of anxiety. Will send covid test.  Re-evaluated pt sats remain 100% and pt states he is feeling better.   Pt handed off pending repeat trop but suspect dc home if negative.                 ____________________________________________   FINAL CLINICAL IMPRESSION(S) / ED DIAGNOSES   Final diagnoses:  Atypical chest pain     MEDICATIONS GIVEN DURING THIS VISIT:  Medications - No data to display   ED Discharge Orders         Ordered    hydrOXYzine (ATARAX/VISTARIL) 25 MG tablet  Every  8 hours PRN,   Status:  Discontinued     11/10/19 0754    hydrOXYzine (ATARAX/VISTARIL) 25 MG tablet  Every 8 hours PRN,   Status:  Discontinued     11/10/19 0800    hydrOXYzine (ATARAX/VISTARIL) 25 MG tablet  Every 8 hours PRN     11/10/19 0808           Note:  This document was prepared using Dragon voice recognition software and may include unintentional dictation errors.   Vanessa Colfax, MD 11/10/19 [phone]

## 2019-11-10 NOTE — ED Provider Notes (Signed)
Assumed care from Dr. Fuller Plan at 7 AM. Briefly, the patient is a 38 y.o. male with PMHx of  has no past medical history on file. here with chest pain, transient. EKG shows BER but no ischemic changes. Initial trop neg. CXR clear. No clinical signs of PE, PNA, PTX. Plan to repeat delta trop. Suspect there could be a component of anxiety.   Labs Reviewed  COMPREHENSIVE METABOLIC PANEL - Abnormal; Notable for the following components:      Result Value   Glucose, Bld 105 (*)    Creatinine, Ser 1.29 (*)    All other components within normal limits  SARS CORONAVIRUS 2 (TAT 6-24 HRS)  CBC  TROPONIN I (HIGH SENSITIVITY)  TROPONIN I (HIGH SENSITIVITY)    Course of Care: -Repeat trop neg. Pt is HDS in ED. Tele without ectopy or arrhythmia. Will d/c with atarax PRN, outpt follow-up.       Shaune Pollack, MD 11/10/19 [phone]

## 2019-11-10 NOTE — ED Triage Notes (Signed)
Pt in with co shob that started yesterday, none noted at this time. Denies any hx of the same, denies any cold symptoms or recent illness.

## 2019-11-10 NOTE — ED Notes (Signed)
Pt alert and oriented X 4, stable for discharge. RR even and unlabored, color WNL. Discussed discharge instructions and follow up when appropriate. Instructed to follow up with ER for any life threatening symptoms or concerns that patient or family of patient may have  

## 2019-11-10 NOTE — Discharge Instructions (Addendum)
Try taking the Atarax as needed for anxiety  If you notice this chest pain worsens with eating spicy foods, consider starting an antacid  Follow-up with a primary doctor in 1-2 weeks as needed

## 2019-11-10 NOTE — ED Notes (Signed)
Patient states earlier was having some SOB. Patient states SOB is not as bad as earlier. Patient denies asthma or other breathing issues but states he smokes.

## 2020-02-14 ENCOUNTER — Telehealth: Payer: Self-pay | Admitting: General Practice

## 2020-02-14 NOTE — Telephone Encounter (Signed)
Individual has been contacted 3+ times regarding ED referral and has been given information regarding how to become a pt. No further attempts will be made to contact individual. 

## 2023-10-29 ENCOUNTER — Ambulatory Visit: Payer: Self-pay | Admitting: Family Medicine

## 2023-10-29 ENCOUNTER — Encounter: Payer: Self-pay | Admitting: Family Medicine

## 2023-10-29 DIAGNOSIS — Z113 Encounter for screening for infections with a predominantly sexual mode of transmission: Secondary | ICD-10-CM

## 2023-10-29 LAB — HM HEPATITIS C SCREENING LAB: HM Hepatitis Screen: NEGATIVE

## 2023-10-29 LAB — HEPATITIS B SURFACE ANTIGEN

## 2023-10-29 LAB — HM HIV SCREENING LAB: HM HIV Screening: NEGATIVE

## 2023-10-29 NOTE — Progress Notes (Signed)
 [facility] Department STI clinic [address], [address] [place] [address] Main phone: [phone]  STI screening visit  Subjective:  Reginald Sims is a 43 y.o. male being seen today for an STI screening visit. The patient reports they do not have symptoms.    Patient has the following medical conditions:  There are no active problems to display for this patient.   Chief Complaint  Patient presents with   SEXUALLY TRANSMITTED DISEASE    No symptoms/routine check    HPI HPI Patient reports to clinic requesting STI testing- denies symptoms. Pt states he is interested in getting the HPV vaccines. Encouraged to make appt w vaccine clinic.   STI screening history: Last HIV test per patient/review of record was No results found for: "HMHIVSCREEN" No results found for: "HIV"  Last HEPC test per patient/review of record was No results found for: "HMHEPCSCREEN" No components found for: "HEPC"   Last HEPB test per patient/review of record was No components found for: "HMHEPBSCREEN"   Fertility: Does the patient or their partner desires a pregnancy in the next year? No  Screening for MPX risk: Does the patient have an unexplained rash? No Is the patient MSM? No Does the patient endorse multiple sex partners or anonymous sex partners? No Did the patient have close or sexual contact with a person diagnosed with MPX? No Has the patient traveled outside the [place] where MPX is endemic? No Is there a high clinical suspicion for MPX-- evidenced by one of the following No  -Unlikely to be chickenpox  -Lymphadenopathy  -Rash that present in same phase of evolution on any given body part   See flowsheet for further details and programmatic requirements.   Immunization History  Administered Date(s) Administered   Tdap 09/15/2018     The following portions of the patient's history were reviewed and updated as appropriate: allergies, current medications,  past medical history, past social history, past surgical history and problem list.  Objective:  There were no vitals filed for this visit.  Physical Exam Vitals and nursing note reviewed.  Constitutional:      Appearance: Normal appearance.  HENT:     Head: Normocephalic and atraumatic.     Mouth/Throat:     Mouth: Mucous membranes are moist.     Pharynx: No oropharyngeal exudate or posterior oropharyngeal erythema.  Eyes:     General:        Right eye: No discharge.        Left eye: No discharge.     Conjunctiva/sclera:     Right eye: Right conjunctiva is not injected. No exudate.    Left eye: Left conjunctiva is not injected. No exudate. Pulmonary:     Effort: Pulmonary effort is normal.  Abdominal:     General: Abdomen is flat.     Palpations: Abdomen is soft. There is no hepatomegaly or mass.     Tenderness: There is no abdominal tenderness. There is no rebound.  Genitourinary:    Comments: Declined genital exam- asymptomatic Lymphadenopathy:     Cervical: No cervical adenopathy.     Upper Body:     Right upper body: No supraclavicular or axillary adenopathy.     Left upper body: No supraclavicular or axillary adenopathy.  Skin:    General: Skin is warm and dry.  Neurological:     Mental Status: He is alert and oriented to person, place, and time.     Assessment and Plan:  Reginald Sims  Reginald Sims is a 43 y.o. male presenting to the [facility] Department for STI screening  1. Screening for venereal disease (Primary)  - HIV/HCV [facility] - HBV Antigen/Antibody State Lab - Syphilis Serology, [facility] - Chlamydia/GC NAA, Confirmation   Patient does not have STI symptoms Patient accepted all screenings including  urine GC/Chlamydia, and blood work for HIV/Syphilis. Patient meets criteria for HepB screening? Yes. Ordered? yes Patient meets criteria for HepC screening? Yes. Ordered? yes Recommended condom use with all sex Discussed importance of  condom use for STI prevention  Treat positive test results per standing order. Discussed time line for State Lab results and that patient will be called with positive results and encouraged patient to call if he had not heard in 2 weeks Recommended repeat testing in 3 months with positive results. Recommended returning for continued or worsening symptoms.   Return if symptoms worsen or fail to improve, for STI screening.  No future appointments.  Lenice Llamas, [place]

## 2023-11-01 LAB — CHLAMYDIA/GC NAA, CONFIRMATION
Chlamydia trachomatis, NAA: NEGATIVE
Neisseria gonorrhoeae, NAA: NEGATIVE

## 2023-11-06 ENCOUNTER — Telehealth: Payer: Self-pay

## 2023-11-06 DIAGNOSIS — Z8619 Personal history of other infectious and parasitic diseases: Secondary | ICD-10-CM | POA: Insufficient documentation

## 2023-11-06 NOTE — Telephone Encounter (Signed)
 Attempted to contact to advise of test of Hep B test results drawn show on 10/29/23. Hep B results Show immunity due to resolved infection. No answer. LMTRC.

## 2024-01-21 ENCOUNTER — Encounter: Payer: Self-pay | Admitting: Family Medicine

## 2024-01-21 ENCOUNTER — Ambulatory Visit: Payer: Self-pay | Admitting: Family Medicine

## 2024-01-21 DIAGNOSIS — Z113 Encounter for screening for infections with a predominantly sexual mode of transmission: Secondary | ICD-10-CM

## 2024-01-21 DIAGNOSIS — N341 Nonspecific urethritis: Secondary | ICD-10-CM

## 2024-01-21 LAB — HM HEPATITIS C SCREENING LAB: HM Hepatitis Screen: NEGATIVE

## 2024-01-21 LAB — GRAM STAIN

## 2024-01-21 LAB — HM HIV SCREENING LAB: HM HIV Screening: NEGATIVE

## 2024-01-21 MED ORDER — DOXYCYCLINE HYCLATE 100 MG PO TABS: 100.0000 mg | ORAL_TABLET | Freq: Two times a day (BID) | ORAL | Status: AC

## 2024-01-21 NOTE — Progress Notes (Signed)
 [facility] Department STI clinic [address] [place] [address] Main phone: [phone]  STI screening visit  Subjective:  Reginald Sims is a 43 y.o. male being seen today for an STI screening visit. The patient reports they do have symptoms.    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  Flow sheet content below:  Pregnancy Intention Screening Does the patient want to become pregnant in the next year?: Yes Does the patient's partner want to become pregnant in the next year?: Yes Would the patient like to discuss contraceptive options today?: N/A All Patients Anyone smoke around pt and/or pt's children?: No Anyone smoke inside pt's house?: No Anyone smoke inside car?: No Anyone smoke inside the workplace?: No Reason For STD Screen STD Screening: Has symptoms Have you ever had an STD?: No History of Antibiotic use in the past 2 weeks?: No STD Symptoms Lower abdominal pain: Yes Risk Factors for Hep B Household, sexual, or needle sharing contact of a person infected with Hep B: No Sexual contact with a person who uses drugs not as prescribed?: No Currently or Ever used drugs not as prescribed: No HIV Positive: No PRep Patient: No Men who have sex with men: N/A Have Hepatitis C: No History of Incarceration: Yes History of Homeslessness?: No Anal sex following anal drug use?: No Risk Factors for Hep C Currently using drugs not as prescribed: No Sexual partner(s) currently using drugs as not prescribed: No History of drug use: No HIV Positive: No People with a  history of incarceration: Yes People born between the years of 62 and 11: No Hepatitis Counseling Hep B Counseling: Testing not done today as patient is known to be positive for Hep B Hep C Counseling: Patient accepts testing for Hep C today Abuse History Has patient ever been abused physically?: No Has patient ever been abused sexually?: No Does patient feel they have a problem with Anxiety?: Yes Does patient feel they have a problem with Depression?: No Referral to Behavioral Health: Declined Counseling Patient counseled to use condoms with all sex: Condoms declined RTC in 2-3 weeks for test results: Yes Clinic will call if test results abnormal before test result appt.: Yes Test results given to patient Patient counseled to use condoms with all sex: Condoms declined  Screening for MPX risk: Does the patient have an unexplained rash? No Is the patient MSM? No Does the patient endorse multiple sex partners or anonymous sex partners? No Did the patient have close or sexual contact with a person diagnosed with MPX? No Has the patient traveled outside the [place]  where MPX is endemic? No Is there a high clinical suspicion for MPX-- evidenced by one of the following No  -Unlikely to be chickenpox  -Lymphadenopathy  -Rash that present in same phase of evolution on any given body part  STI screening history: Last HIV test per patient/review of record was  Lab Results  Component Value Date   HMHIVSCREEN Negative - Validated 10/29/2023   No results found for: "HIV"  Last HEPC test per patient/review of record was  Lab Results  Component  Value Date   HMHEPCSCREEN Negative-Validated 10/29/2023   No components found for: "HEPC"   Last HEPB test per patient/review of record was No components found for: "HMHEPBSCREEN"   Fertility: Does the patient or their partner desires a pregnancy in the next year? No  Immunization History  Administered Date(s) Administered   Tdap 09/15/2018     The following portions of the patient's history were reviewed and updated as appropriate: allergies, current medications, past medical history, past social history, past surgical history and problem list.  Objective:  There were no vitals filed for this visit.  Physical Exam Exam conducted with a chaperone present Susanna Epley CNA).  Constitutional:      Appearance: Normal appearance.  HENT:     Head: Normocephalic and atraumatic.     Comments: No nits or hair loss    Mouth/Throat:     Mouth: Mucous membranes are moist. No oral lesions.     Pharynx: Oropharynx is clear. No oropharyngeal exudate or posterior oropharyngeal erythema.  Eyes:     General:        Right eye: No discharge.        Left eye: No discharge.     Conjunctiva/sclera:     Right eye: Right conjunctiva is not injected. No exudate.    Left eye: Left conjunctiva is not injected. No exudate. Pulmonary:     Effort: Pulmonary effort is normal.  Abdominal:     General: Abdomen is flat.     Palpations: Abdomen is soft. There is no hepatomegaly or mass.     Tenderness: There is no abdominal tenderness. There is no rebound.     Hernia: There is no hernia in the left inguinal area or right inguinal area.  Genitourinary:    Pubic Area: No rash or pubic lice (no nits).      Penis: Normal and uncircumcised. No tenderness, discharge, swelling or lesions.      Testes: Normal.     Epididymis:     Right: Normal. No mass or tenderness.     Left: Normal. No mass or tenderness.     Rectum: Normal. No tenderness (no lesions or discharge).       Comments: Penile Discharge Amount: small Color:  clear  Fissures around head of penis Lymphadenopathy:     Head:     Right side of head: No preauricular or posterior auricular adenopathy.     Left side of head: No preauricular or posterior auricular adenopathy.     Cervical: No cervical adenopathy.     Upper Body:     Right upper body: No supraclavicular, axillary or epitrochlear  adenopathy.     Left upper body: No supraclavicular, axillary or epitrochlear adenopathy.     Lower Body: No right inguinal adenopathy. No left inguinal adenopathy.  Skin:    General: Skin is warm and dry.     Findings: No lesion or rash.  Neurological:     Mental Status: He is alert and oriented to person, place, and time.      Assessment and Plan:  Reginald Sims is a 43 y.o. male presenting to the [facility] Department for STI screening  1. Screening for venereal disease (Primary)  - Syphilis Serology, [place] [facility] - Chlamydia/GC NAA, Confirmation - Gram stain - HIV/HCV [facility]   Patient does have STI symptoms Patient accepted the following screenings: penile gram stain for GC, urine CT/GC, HIV, RPR, and Hep C Patient meets criteria for HepB  screening? No. Ordered? not applicable Patient meets criteria for HepC screening? Yes. Ordered? yes Recommended condom use with all sex Discussed importance of condom use for STI prevention  Treat positive test results per standing order. Discussed time line for State Lab results and that patient will be called with positive results and encouraged patient to call if he had not heard in 2 weeks Recommended repeat testing in 3 months with positive results. Recommended returning for continued or worsening symptoms.   Return if symptoms worsen or fail to improve, for STI screening.  No future appointments.  Earleen Glazier, [place]

## 2024-01-21 NOTE — Progress Notes (Signed)
 Pt is here for STD screening. The patient was dispensed Doxycycline 100 mg 2x/day for 7 days today. Counseling provided today regarding the medication. We discussed the medication, the side effects and when to call clinic. Patient given the opportunity to ask questions for any clarification. Condoms, brochure and contact card given. Austine Lefort, RN.

## 2024-01-21 NOTE — Addendum Note (Signed)
 Addended by: Earleen Glazier on: 01/21/2024 04:39 PM   Modules accepted: Orders

## 2024-01-24 LAB — CHLAMYDIA/GC NAA, CONFIRMATION
Chlamydia trachomatis, NAA: NEGATIVE
Neisseria gonorrhoeae, NAA: NEGATIVE

## 2024-02-02 ENCOUNTER — Encounter: Payer: Self-pay | Admitting: Family Medicine

## 2024-03-21 DIAGNOSIS — L255 Unspecified contact dermatitis due to plants, except food: Secondary | ICD-10-CM | POA: Diagnosis not present
